# Patient Record
Sex: Male | Born: 1980 | Race: Black or African American | Hispanic: No | Marital: Married | Smoking: Never smoker
Health system: Southern US, Community
[De-identification: ages and names within clinical notes are randomized; demographics above are authoritative.]

## PROBLEM LIST (undated history)

## (undated) DIAGNOSIS — Z9989 Dependence on other enabling machines and devices: Secondary | ICD-10-CM

## (undated) DIAGNOSIS — G4733 Obstructive sleep apnea (adult) (pediatric): Secondary | ICD-10-CM

## (undated) DIAGNOSIS — L409 Psoriasis, unspecified: Secondary | ICD-10-CM

## (undated) HISTORY — PX: NO PAST SURGERIES: SHX2092

## (undated) HISTORY — DX: Obstructive sleep apnea (adult) (pediatric): G47.33

## (undated) HISTORY — DX: Dependence on other enabling machines and devices: Z99.89

## (undated) HISTORY — DX: Psoriasis, unspecified: L40.9

---

## 2015-12-22 DIAGNOSIS — G4733 Obstructive sleep apnea (adult) (pediatric): Secondary | ICD-10-CM | POA: Insufficient documentation

## 2015-12-23 DIAGNOSIS — J309 Allergic rhinitis, unspecified: Secondary | ICD-10-CM | POA: Insufficient documentation

## 2019-05-09 ENCOUNTER — Other Ambulatory Visit: Payer: Self-pay

## 2019-05-09 ENCOUNTER — Ambulatory Visit
Admission: EM | Admit: 2019-05-09 | Discharge: 2019-05-09 | Disposition: A | Discharge status: Home / Self Care | Payer: BC Managed Care – PPO | Attending: Family Medicine | Admitting: Family Medicine

## 2019-05-09 ENCOUNTER — Ambulatory Visit (INDEPENDENT_AMBULATORY_CARE_PROVIDER_SITE_OTHER): Payer: BC Managed Care – PPO

## 2019-05-09 DIAGNOSIS — Y9302 Activity, running: Secondary | ICD-10-CM | POA: Diagnosis not present

## 2019-05-09 DIAGNOSIS — M25572 Pain in left ankle and joints of left foot: Secondary | ICD-10-CM | POA: Diagnosis not present

## 2019-05-09 DIAGNOSIS — S93402A Sprain of unspecified ligament of left ankle, initial encounter: Secondary | ICD-10-CM | POA: Diagnosis not present

## 2019-05-09 MED ORDER — MELOXICAM 15 MG PO TABS: 15.0000 mg | ORAL_TABLET | Freq: Every day | ORAL | 0 refills | Status: DC | PRN

## 2019-05-09 NOTE — ED Triage Notes (Signed)
Patient states that he was running today and heard and felt pop around 12pm in his left ankle. Patient states that pain has been worsening and he is unable to bear weight. Has been applying ice and elevate.

## 2019-05-09 NOTE — ED Provider Notes (Addendum)
[facility]    CSN: [id number] Arrival date & time: 05/09/19  1737      History   Chief Complaint Chief Complaint  Patient presents with  . Ankle Pain    left   HPI  39 year old male presents with the above complaint.  Patient states that he was running today and suffered an inversion injury of his left ankle.  Patient states that he felt a pop.  He reports severe pain, 9/10 in severity.  Difficulty bearing weight.  He has applied ice and elevated the area without resolution.  No relieving factors.  No other associated symptoms.  No other complaints.  Past Surgical History:  Procedure Laterality Date  . NO PAST SURGERIES     Home Medications    Prior to Admission medications   Medication Sig Start Date End Date Taking? Authorizing Provider  Multiple Vitamin (MULTIVITAMIN) tablet Take 1 tablet by mouth daily.   Yes [provider]  meloxicam (MOBIC) 15 MG tablet Take 1 tablet (15 mg total) by mouth daily as needed for pain. 05/09/19   Coral Spikes, DO    Family History Family History  Problem Relation Age of Onset  . Arthritis Mother   . Heart disease Father     Social History Social History   Tobacco Use  . Smoking status: Never Smoker  . Smokeless tobacco: Never Used  Substance Use Topics  . Alcohol use: Yes    Comment: occasionally  . Drug use: Never     Allergies   Patient has no known allergies.   Review of Systems Review of Systems  Constitutional: Negative.   Musculoskeletal:       Left ankle pain.   Physical Exam Triage Vital Signs ED Triage Vitals  Enc Vitals Group     BP 05/09/19 1820 118/85     Pulse Rate 05/09/19 1820 71     Resp 05/09/19 1820 16     Temp 05/09/19 1820 98.9 F (37.2 C)     Temp Source 05/09/19 1820 Oral     SpO2 05/09/19 1820 100 %     Weight 05/09/19 1818 203 lb (92.1 kg)     Height 05/09/19 1818 5\' 9"  (1.753 m)     Head Circumference --      Peak Flow --      Pain Score 05/09/19 1818 9   Pain Loc --      Pain Edu? --      Excl. in [place]? --    Updated Vital Signs BP 118/85 (BP Location: Right Arm)   Pulse 71   Temp 98.9 F (37.2 C) (Oral)   Resp 16   Ht 5\' 9"  (1.753 m)   Wt 92.1 kg   SpO2 100%   BMI 29.98 kg/m   Visual Acuity Right Eye Distance:   Left Eye Distance:   Bilateral Distance:    Right Eye Near:   Left Eye Near:    Bilateral Near:     Physical Exam Vitals and nursing note reviewed.  Constitutional:      General: He is not in acute distress.    Appearance: Normal appearance. He is not ill-appearing.  Eyes:     General:        Right eye: No discharge.        Left eye: No discharge.     Conjunctiva/sclera: Conjunctivae normal.  Cardiovascular:     Rate and Rhythm: Normal rate and regular rhythm.     Heart  sounds: No murmur.  Pulmonary:     Effort: Pulmonary effort is normal.     Breath sounds: Normal breath sounds. No wheezing, rhonchi or rales.  Musculoskeletal:     Comments: Left ankle -no apparent swelling.  Tenderness over the medial malleolus.  No bruising.  Neurological:     Mental Status: He is alert.  Psychiatric:        Mood and Affect: Mood normal.        Behavior: Behavior normal.    UC Treatments / Results  Labs (all labs ordered are listed, but only abnormal results are displayed) Labs Reviewed - No data to display  EKG   Radiology DG Ankle Complete Left  Result Date: 05/09/2019 CLINICAL DATA:  Felt a pop while running, unable to bear weight, worsening pain EXAM: LEFT ANKLE COMPLETE - 3+ VIEW COMPARISON:  None FINDINGS: Osseous mineralization normal. Joint spaces preserved. No acute fracture, dislocation, or bone destruction. IMPRESSION: No acute osseous abnormalities. Electronically Signed   By: Ulyses Southward M.D.   On: 05/09/2019 18:56    Procedures Procedures (including critical care time)  Medications Ordered in UC Medications - No data to display  Initial Impression / Assessment and Plan / UC Course  I have  reviewed the triage vital signs and the nursing notes.  Pertinent labs & imaging results that were available during my care of the patient were reviewed by me and considered in my medical decision making (see chart for details).    39 year old male presents with right ankle pain and swelling after an inversion injury.  X-ray obtained given physical exam and x-ray was independently interpreted by me.  Interpretation: No fracture.  No apparent abnormalities.  Normal x-ray.  Patient appears to have an ankle sprain.  Advise rest, ice, elevation.  Meloxicam as directed.  Supportive care.  Final Clinical Impressions(s) / UC Diagnoses   Final diagnoses:  Sprain of left ankle, unspecified ligament, initial encounter     Discharge Instructions     Rest. Ice. Elevation.  Medication as directed.  Take care  Dr. Adriana Simas    ED Prescriptions    Medication Sig Dispense Auth. Provider   meloxicam (MOBIC) 15 MG tablet Take 1 tablet (15 mg total) by mouth daily as needed for pain. 30 tablet Tommie Sams, DO     PDMP not reviewed this encounter.     Tommie Sams, [place] 05/09/19 1919

## 2019-05-09 NOTE — Discharge Instructions (Signed)
Rest. Ice. Elevation.  Medication as directed.  Take care  Dr. Adriana Simas

## 2019-10-15 DIAGNOSIS — J069 Acute upper respiratory infection, unspecified: Secondary | ICD-10-CM | POA: Insufficient documentation

## 2019-11-19 ENCOUNTER — Ambulatory Visit (INDEPENDENT_AMBULATORY_CARE_PROVIDER_SITE_OTHER): Payer: BC Managed Care – PPO | Admitting: Internal Medicine

## 2019-11-19 VITALS — BP 115/77 | HR 82 | Temp 97.9°F | Ht 67.0 in | Wt 201.0 lb

## 2019-11-19 DIAGNOSIS — Z7189 Other specified counseling: Secondary | ICD-10-CM | POA: Diagnosis not present

## 2019-11-19 DIAGNOSIS — Z9989 Dependence on other enabling machines and devices: Secondary | ICD-10-CM

## 2019-11-19 DIAGNOSIS — G4733 Obstructive sleep apnea (adult) (pediatric): Secondary | ICD-10-CM

## 2019-11-19 NOTE — Progress Notes (Signed)
Sleep Medicine   Office Visit  Patient Name: Cory Ferguson DOB: 04-16-1980 MRN [id number]    Chief Complaint: OSA, new consult  Brief History:  Cecilio presents with a 5 year history of OSA and has been wearing CPAP since diagnosis. Since his diagnosis he has been losing weight in order to reverse his sleep apnea. His current machine is now part of the current recall. He is not comfortable wearing the CPAP a part of the recall. He would also like to discuss other options to treat his OSA instead of CPAP if he still has it. Usually falls asleep around 12 am and wakes around 6-6:30 am, sleeps all through the night. When he has not been wearing his CPAP he does notice he wakes up feeling groggy as well as headaches. The Epworth Sleepiness Score is 7 out of 24 .    ROS  General: (-) fever, (-) chills, (-) night sweat Nose and Sinuses: (-) nasal stuffiness or itchiness, (-) postnasal drip, (-) nosebleeds, (-) sinus trouble. Mouth and Throat: (-) sore throat, (-) hoarseness. Neck: (-) swollen glands, (-) enlarged thyroid, (-) neck pain. Respiratory: - cough, - shortness of breath, - wheezing. Neurologic: - numbness, - tingling. Psychiatric: - anxiety, - depression Sleep behavior: -sleep paralysis -hypnogogic hallucinations -dream enactment -vivid dreams -cataplexy -night terrors -sleep walking   Current Medication: Outpatient Encounter Medications as of 11/19/2019  Medication Sig  . Ciclopirox 1 % shampoo Apply topically.  . meloxicam (MOBIC) 15 MG tablet Take 1 tablet (15 mg total) by mouth daily as needed for pain.  . Multiple Vitamin (MULTIVITAMIN) tablet Take 1 tablet by mouth daily.  . Multiple Vitamins-Minerals (ONE DAILY CALCIUM/IRON) TABS Take by mouth.   No facility-administered encounter medications on file as of 11/19/2019.    Surgical History: Past Surgical History:  Procedure Laterality Date  . NO PAST SURGERIES      Medical History: Past Medical History:  Diagnosis  Date  . OSA on CPAP   . Psoriasis     Family History: Non contributory to the present illness  Social History: Social History   Socioeconomic History  . Marital status: Married    Spouse name: Not on file  . Number of children: Not on file  . Years of education: Not on file  . Highest education level: Not on file  Occupational History  . Not on file  Tobacco Use  . Smoking status: Never Smoker  . Smokeless tobacco: Never Used  Vaping Use  . Vaping Use: Never used  Substance and Sexual Activity  . Alcohol use: Yes    Comment: occasionally  . Drug use: Never  . Sexual activity: Not on file  Other Topics Concern  . Not on file  Social History Narrative  . Not on file   Social Determinants of Health   Financial Resource Strain:   . Difficulty of Paying Living Expenses: Not on file  Food Insecurity:   . Worried About Programme researcher, broadcasting/film/video in the Last Year: Not on file  . Ran Out of Food in the Last Year: Not on file  Transportation Needs:   . Lack of Transportation (Medical): Not on file  . Lack of Transportation (Non-Medical): Not on file  Physical Activity:   . Days of Exercise per Week: Not on file  . Minutes of Exercise per Session: Not on file  Stress:   . Feeling of Stress : Not on file  Social Connections:   . Frequency of Communication with Friends  and Family: Not on file  . Frequency of Social Gatherings with Friends and Family: Not on file  . Attends Religious Services: Not on file  . Active Member of Clubs or Organizations: Not on file  . Attends Banker Meetings: Not on file  . Marital Status: Not on file  Intimate Partner Violence:   . Fear of Current or Ex-Partner: Not on file  . Emotionally Abused: Not on file  . Physically Abused: Not on file  . Sexually Abused: Not on file    Vital Signs: Blood pressure 115/77, pulse 82, temperature 97.9 F (36.6 C), height 5\' 7"  (1.702 m), weight 201 lb (91.2 kg), SpO2 99  %.  Examination: General Appearance: The patient is well-developed, well-nourished, and in no distress. Neck Circumference: 38 Skin: Gross inspection of skin unremarkable. Head: normocephalic, no gross deformities. Eyes: no gross deformities noted. ENT: ears appear grossly normal Neurologic: Alert and oriented. No involuntary movements.    EPWORTH SLEEPINESS SCALE:  Scale:  (0)= no chance of dozing; (1)= slight chance of dozing; (2)= moderate chance of dozing; (3)= high chance of dozing  Chance  Situtation    Sitting and reading: 2    Watching TV: 1    Sitting Inactive in public: 0    As a passenger in car: 3      Lying down to rest: 1    Sitting and talking: 0    Sitting quielty after lunch: 0    In a car, stopped in traffic: 0   TOTAL SCORE:   7 out of 24    SLEEP STUDIES:  1. Not available   LABS: No results found for this or any previous visit (from the past 2160 hour(s)).  Radiology: DG Ankle Complete Left  Result Date: 05/09/2019 CLINICAL DATA:  Felt a pop while running, unable to bear weight, worsening pain EXAM: LEFT ANKLE COMPLETE - 3+ VIEW COMPARISON:  None FINDINGS: Osseous mineralization normal. Joint spaces preserved. No acute fracture, dislocation, or bone destruction. IMPRESSION: No acute osseous abnormalities. Electronically Signed   By: 07/09/2019 M.D.   On: 05/09/2019 18:56    Assessment and Plan: Patient Active Problem List   Diagnosis Date Noted  . OSA (obstructive sleep apnea) 12/22/2015     PLAN OSA:   Patient evaluation suggests high risk of sleep disordered breathing due to snoring and apnea during the night. Patient has comorbid cardiovascular risk factors including: none which could be exacerbated by pathologic sleep-disordered breathing.    1. OSA-Continue with wearing his current CPAP even though it is currently under recall. Will redo PSG to assess for current OSA diagnosis, he would also like to look into getting a new  machine as well as mouth guard. 2. Morbid obesity diet and exercise counseling. Weight loss will certainly help with overall status  General Counseling: I have discussed the findings of the evaluation and examination with Ej.  I have also discussed any further diagnostic evaluation thatmay be needed or ordered today. Sylvester verbalizes understanding of the findings of todays visit. We also reviewed his medications today and discussed drug interactions and side effects including but not limited excessive drowsiness and altered mental states. We also discussed that there is always a risk not just to him but also people around him. he has been encouraged to call the office with any questions or concerns that should arise related to todays visit.     This patient was seen by 12/24/2015 AGNP-C in Collaboration with  Dr. Freda Munro as a part of collaborative care agreement.   I have personally obtained a history, evaluated the patient, evaluated pertinent data, formulated the assessment and plan and placed orders.   Valentino Hue Sol Blazing, PhD, FAASM  Diplomate, American Board of Sleep Medicine    Yevonne Pax, MD [facility] Diplomate ABMS Pulmonary and Critical Care Medicine Sleep medicine

## 2019-11-19 NOTE — Patient Instructions (Signed)

## 2020-01-07 ENCOUNTER — Ambulatory Visit: Payer: BC Managed Care – PPO

## 2020-01-07 NOTE — Progress Notes (Unsigned)
Sleep Medicine   Office Visit  Patient Name: Cory Ferguson DOB: 1980-06-05 MRN [id number]    Chief Complaint: study results  Brief History:  Cory Ferguson is seen for initial consultation to discuss the results of his recent sleep study which did not demonstrate sleep apnea except during supine, REM sleep. The presents with a *** history of ***. This is *** in the *** position.   Sleep quality is ***. This is noted *** nights. The patient's bed partner reports  *** at night. The patient relates the following symptoms: *** are also present. The patient goes to sleep at *** and wakes up at ***.  he reports that his sleep quality is ***.  Sleep quality is *** when outside home environment.  Patient has noted *** of his legs at night.  The patient  relates *** behavior during the night.  The patient *** a history of psychiatric problems. The Epworth Sleepiness Score is *** out of 24 .  The patient relates  Cardiovascular risk factors include: *** The patient reports ***    ROS  General: (-) fever, (-) chills, (-) night sweat Nose and Sinuses: (-) nasal stuffiness or itchiness, (-) postnasal drip, (-) nosebleeds, (-) sinus trouble. Mouth and Throat: (-) sore throat, (-) hoarseness. Neck: (-) swollen glands, (-) enlarged thyroid, (-) neck pain. Respiratory: *** cough, *** shortness of breath, *** wheezing. Neurologic: *** numbness, *** tingling. Psychiatric: *** anxiety, *** depression Sleep behavior: ***sleep paralysis ***hypnogogic hallucinations ***dream enactment      ***vivid dreams ***cataplexy ***night terrors ***sleep walking   Current Medication: Outpatient Encounter Medications as of 01/07/2020  Medication Sig  . Ciclopirox 1 % shampoo Apply topically.  . meloxicam (MOBIC) 15 MG tablet Take 1 tablet (15 mg total) by mouth daily as needed for pain.  . Multiple Vitamin (MULTIVITAMIN) tablet Take 1 tablet by mouth daily.  . Multiple Vitamins-Minerals (ONE DAILY CALCIUM/IRON) TABS Take by  mouth.   No facility-administered encounter medications on file as of 01/07/2020.    Surgical History: Past Surgical History:  Procedure Laterality Date  . NO PAST SURGERIES      Medical History: Past Medical History:  Diagnosis Date  . OSA on CPAP   . Psoriasis     Family History: Non contributory to the present illness  Social History: Social History   Socioeconomic History  . Marital status: Married    Spouse name: Not on file  . Number of children: Not on file  . Years of education: Not on file  . Highest education level: Not on file  Occupational History  . Not on file  Tobacco Use  . Smoking status: Never Smoker  . Smokeless tobacco: Never Used  Vaping Use  . Vaping Use: Never used  Substance and Sexual Activity  . Alcohol use: Yes    Comment: occasionally  . Drug use: Never  . Sexual activity: Not on file  Other Topics Concern  . Not on file  Social History Narrative  . Not on file   Social Determinants of Health   Financial Resource Strain:   . Difficulty of Paying Living Expenses: Not on file  Food Insecurity:   . Worried About Programme researcher, broadcasting/film/video in the Last Year: Not on file  . Ran Out of Food in the Last Year: Not on file  Transportation Needs:   . Lack of Transportation (Medical): Not on file  . Lack of Transportation (Non-Medical): Not on file  Physical Activity:   . Days of  Exercise per Week: Not on file  . Minutes of Exercise per Session: Not on file  Stress:   . Feeling of Stress : Not on file  Social Connections:   . Frequency of Communication with Friends and Family: Not on file  . Frequency of Social Gatherings with Friends and Family: Not on file  . Attends Religious Services: Not on file  . Active Member of Clubs or Organizations: Not on file  . Attends Banker Meetings: Not on file  . Marital Status: Not on file  Intimate Partner Violence:   . Fear of Current or Ex-Partner: Not on file  . Emotionally Abused: Not  on file  . Physically Abused: Not on file  . Sexually Abused: Not on file    Vital Signs: There were no vitals taken for this visit.  Examination: General Appearance: The patient is well-developed, well-nourished, and in no distress. Neck Circumference: *** Skin: Gross inspection of skin unremarkable. Head: normocephalic, no gross deformities. Eyes: no gross deformities noted. ENT: ears appear grossly normal Neurologic: Alert and oriented. No involuntary movements.    EPWORTH SLEEPINESS SCALE:  Scale:  (0)= no chance of dozing; (1)= slight chance of dozing; (2)= moderate chance of dozing; (3)= high chance of dozing  Chance  Situtation    Sitting and reading: ***    Watching TV: ***    Sitting Inactive in public: ***    As a passenger in car: ***      Lying down to rest: ***    Sitting and talking: ***    Sitting quielty after lunch: ***    In a car, stopped in traffic: ***   TOTAL SCORE:   *** out of 24    SLEEP STUDIES:  1. PSG 12/28/19 apnea only supine   LABS: No results found for this or any previous visit (from the past 2160 hour(s)).  Radiology: DG Ankle Complete Left  Result Date: 05/09/2019 CLINICAL DATA:  Felt a pop while running, unable to bear weight, worsening pain EXAM: LEFT ANKLE COMPLETE - 3+ VIEW COMPARISON:  None FINDINGS: Osseous mineralization normal. Joint spaces preserved. No acute fracture, dislocation, or bone destruction. IMPRESSION: No acute osseous abnormalities. Electronically Signed   By: Ulyses Southward M.D.   On: 05/09/2019 18:56    No results found.  No results found.    Assessment and Plan: Patient Active Problem List   Diagnosis Date Noted  . OSA (obstructive sleep apnea) 12/22/2015     The study results and recommendations were discussed with the patient    1. ***  General Counseling: I have discussed the findings of the evaluation and examination with Cory Ferguson.  I have also discussed any further diagnostic  evaluation thatmay be needed or ordered today. Cory Ferguson verbalizes understanding of the findings of todays visit. We also reviewed his medications today and discussed drug interactions and side effects including but not limited excessive drowsiness and altered mental states. We also discussed that there is always a risk not just to him but also people around him. he has been encouraged to call the office with any questions or concerns that should arise related to todays visit.  No orders of the defined types were placed in this encounter.       I have personally obtained a history, evaluated the patient, evaluated pertinent data, formulated the assessment and plan and placed orders.   Valentino Hue Sol Blazing, PhD, FAASM  Diplomate, American Board of Sleep Medicine    Saadat A  Humphrey Rolls, MD [facility] Diplomate ABMS Pulmonary and Critical Care Medicine Sleep medicine

## 2020-01-28 ENCOUNTER — Ambulatory Visit: Payer: BC Managed Care – PPO | Admitting: Internal Medicine

## 2020-02-03 NOTE — Progress Notes (Signed)
Patient was no-show for appointment.  The office staff will contact the patient for rescheduling follow-up. 

## 2020-02-04 ENCOUNTER — Ambulatory Visit (INDEPENDENT_AMBULATORY_CARE_PROVIDER_SITE_OTHER): Payer: BC Managed Care – PPO | Admitting: Internal Medicine

## 2020-02-04 VITALS — BP 148/75 | HR 67 | Temp 97.7°F | Resp 14 | Ht 69.0 in | Wt 206.0 lb

## 2020-02-04 DIAGNOSIS — G4733 Obstructive sleep apnea (adult) (pediatric): Secondary | ICD-10-CM

## 2020-02-04 NOTE — Progress Notes (Signed)
Sleep Medicine   Office Visit  Patient Name: Cory Ferguson DOB: Mar 30, 1980 MRN [id number]    Chief Complaint: study results   HISTORY OF PRESENT ILLNESS: Cory Ferguson is seen today for follow up to discuss the results of his recent repeat sleep study. The finding did not demonstrate sleep apnea. The Epworth Sleepiness Score is 9.  He reports sleeping well. He has some shoulder irritation which may prevent him from sleeping on one side. He is being seeing Dr. Edrick Kins. His CPAP machine is on the recall.  ROS  General: (-) fever, (-) chills, (-) night sweat Nose and Sinuses: (-) nasal stuffiness or itchiness, (-) postnasal drip, (-) nosebleeds, (-) sinus trouble. Mouth and Throat: (-) sore throat, (-) hoarseness. Neck: (-) swollen glands, (-) enlarged thyroid, (-) neck pain. Respiratory: - cough, - shortness of breath, - wheezing. Neurologic: - numbness, - tingling. Psychiatric: - anxiety, - depression  Current Medication: Outpatient Encounter Medications as of 02/04/2020  Medication Sig  . Ciclopirox 1 % shampoo Apply topically.  . meloxicam (MOBIC) 15 MG tablet Take 1 tablet (15 mg total) by mouth daily as needed for pain.  . Multiple Vitamin (MULTIVITAMIN) tablet Take 1 tablet by mouth daily.  . Multiple Vitamins-Minerals (ONE DAILY CALCIUM/IRON) TABS Take by mouth.   No facility-administered encounter medications on file as of 02/04/2020.    Surgical History: Past Surgical History:  Procedure Laterality Date  . NO PAST SURGERIES      Medical History: Past Medical History:  Diagnosis Date  . OSA on CPAP   . Psoriasis     Family History: Non contributory to the present illness  Social History: Social History   Socioeconomic History  . Marital status: Married    Spouse name: Not on file  . Number of children: Not on file  . Years of education: Not on file  . Highest education level: Not on file  Occupational History  . Not on file  Tobacco Use  . Smoking status:  Never Smoker  . Smokeless tobacco: Never Used  Vaping Use  . Vaping Use: Never used  Substance and Sexual Activity  . Alcohol use: Yes    Comment: occasionally  . Drug use: Never  . Sexual activity: Not on file  Other Topics Concern  . Not on file  Social History Narrative  . Not on file   Social Determinants of Health   Financial Resource Strain:   . Difficulty of Paying Living Expenses: Not on file  Food Insecurity:   . Worried About Programme researcher, broadcasting/film/video in the Last Year: Not on file  . Ran Out of Food in the Last Year: Not on file  Transportation Needs:   . Lack of Transportation (Medical): Not on file  . Lack of Transportation (Non-Medical): Not on file  Physical Activity:   . Days of Exercise per Week: Not on file  . Minutes of Exercise per Session: Not on file  Stress:   . Feeling of Stress : Not on file  Social Connections:   . Frequency of Communication with Friends and Family: Not on file  . Frequency of Social Gatherings with Friends and Family: Not on file  . Attends Religious Services: Not on file  . Active Member of Clubs or Organizations: Not on file  . Attends Banker Meetings: Not on file  . Marital Status: Not on file  Intimate Partner Violence:   . Fear of Current or Ex-Partner: Not on file  . Emotionally Abused: Not  on file  . Physically Abused: Not on file  . Sexually Abused: Not on file    Vital Signs: Blood pressure (!) 148/75, pulse 67, temperature 97.7 F (36.5 C), temperature source Temporal, resp. rate 14, height 5\' 9"  (1.753 m), weight 206 lb (93.4 kg), SpO2 99 %.  Examination: General Appearance: The patient is well-developed, well-nourished, and in no distress. Neck Circumference: 39 Skin: Gross inspection of skin unremarkable. Head: normocephalic, no gross deformities. Eyes: no gross deformities noted. ENT: ears appear grossly normal Neurologic: Alert and oriented. No involuntary movements.    EPWORTH SLEEPINESS  SCALE:  Scale:  (0)= no chance of dozing; (1)= slight chance of dozing; (2)= moderate chance of dozing; (3)= high chance of dozing  Chance  Situtation    Sitting and reading: 2    Watching TV: 1    Sitting Inactive in public: 0    As a passenger in car: 3      Lying down to rest: 2    Sitting and talking: 0    Sitting quielty after lunch: 1    In a car, stopped in traffic: 0   TOTAL SCORE:   9 out of 24    SLEEP STUDIES:  1. PSG 10.22.21 -  AHI  2.3,  low SpO2  92%   LABS: No results found for this or any previous visit (from the past 2160 hour(s)).  Radiology: DG Ankle Complete Left  Result Date: 05/09/2019 CLINICAL DATA:  Felt a pop while running, unable to bear weight, worsening pain EXAM: LEFT ANKLE COMPLETE - 3+ VIEW COMPARISON:  None FINDINGS: Osseous mineralization normal. Joint spaces preserved. No acute fracture, dislocation, or bone destruction. IMPRESSION: No acute osseous abnormalities. Electronically Signed   By: 07/09/2019 M.D.   On: 05/09/2019 18:56    No results found.  No results found.    Assessment and Plan: Patient Active Problem List   Diagnosis Date Noted  . Upper respiratory tract infection 10/15/2019  . Allergic rhinitis 12/23/2015  . OSA (obstructive sleep apnea) 12/22/2015     PLAN OSA:   The study results were discussed with the patient. He no longer has significant sleep apnea. He never tolerated CPAP and at this point no longer requires CPAP. He was advised to remain vigilant for any returning symptoms of sleep apnea. He should avoid sleeping on his back . He is interested in treating his snoring and will follow up with Dr. 12/24/2015 regarding the oral appliance. This may be of significant benefit if his weight varies. He is treating his nasal congestion.   1. OSA- patient no longer has significant sleep apnea. Will follow up with Dr. Edrick Kins.  General Counseling: I have discussed the findings of the evaluation and examination  with [facility].  I have also discussed any further diagnostic evaluation thatmay be needed or ordered today. Marven verbalizes understanding of the findings of todays visit. We also reviewed his medications today and discussed drug interactions and side effects including but not limited excessive drowsiness and altered mental states. We also discussed that there is always a risk not just to him but also people around him. he has been encouraged to call the office with any questions or concerns that should arise related to todays visit.  No orders of the defined types were placed in this encounter.       I have personally obtained a history, evaluated the patient, evaluated pertinent data, formulated the assessment and plan and placed orders.   Edrick Kins  Bonne Dolores, PhD, FAASM  Diplomate, American Board of Sleep Medicine    Yevonne Pax, MD [facility] Diplomate ABMS Pulmonary and Critical Care Medicine Sleep medicine

## 2020-02-08 ENCOUNTER — Encounter: Payer: Self-pay | Admitting: Internal Medicine

## 2020-03-17 ENCOUNTER — Other Ambulatory Visit: Payer: Self-pay

## 2020-03-17 DIAGNOSIS — Z20822 Contact with and (suspected) exposure to covid-19: Secondary | ICD-10-CM

## 2020-03-18 LAB — NOVEL CORONAVIRUS, NAA: SARS-CoV-2, NAA: NOT DETECTED

## 2020-03-18 LAB — SARS-COV-2, NAA 2 DAY TAT

## 2020-03-18 LAB — SPECIMEN STATUS REPORT

## 2021-06-01 IMAGING — CR DG ANKLE COMPLETE 3+V*L*
4 series · 4 of 4 positions shown · non-contrast
Comparison: None

CLINICAL DATA: Felt a pop while running, unable to bear weight,
worsening pain

EXAM:
LEFT ANKLE COMPLETE - 3+ VIEW

[ankle ap]
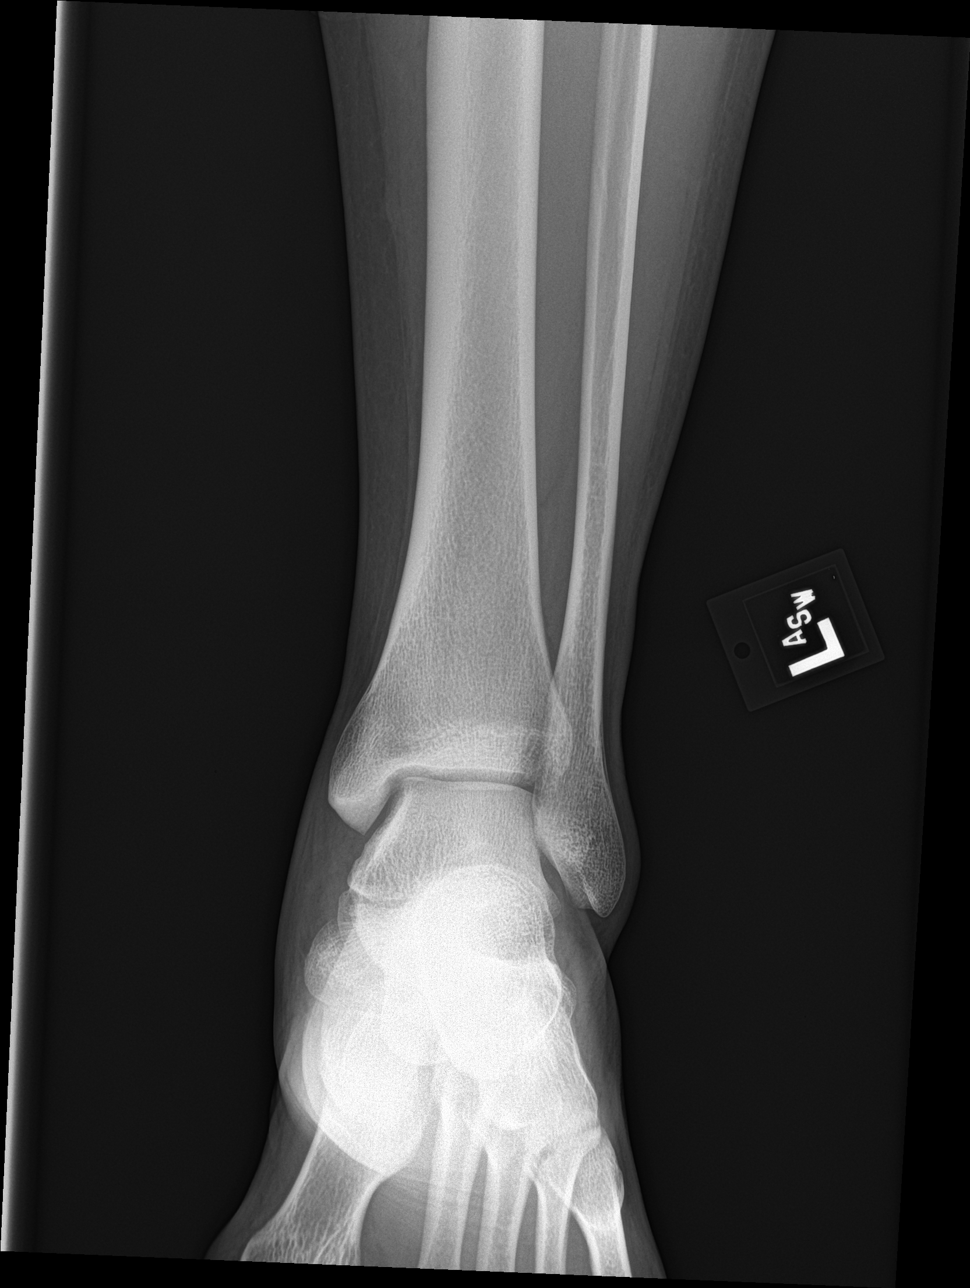

[ankle obl (1 of 2)]
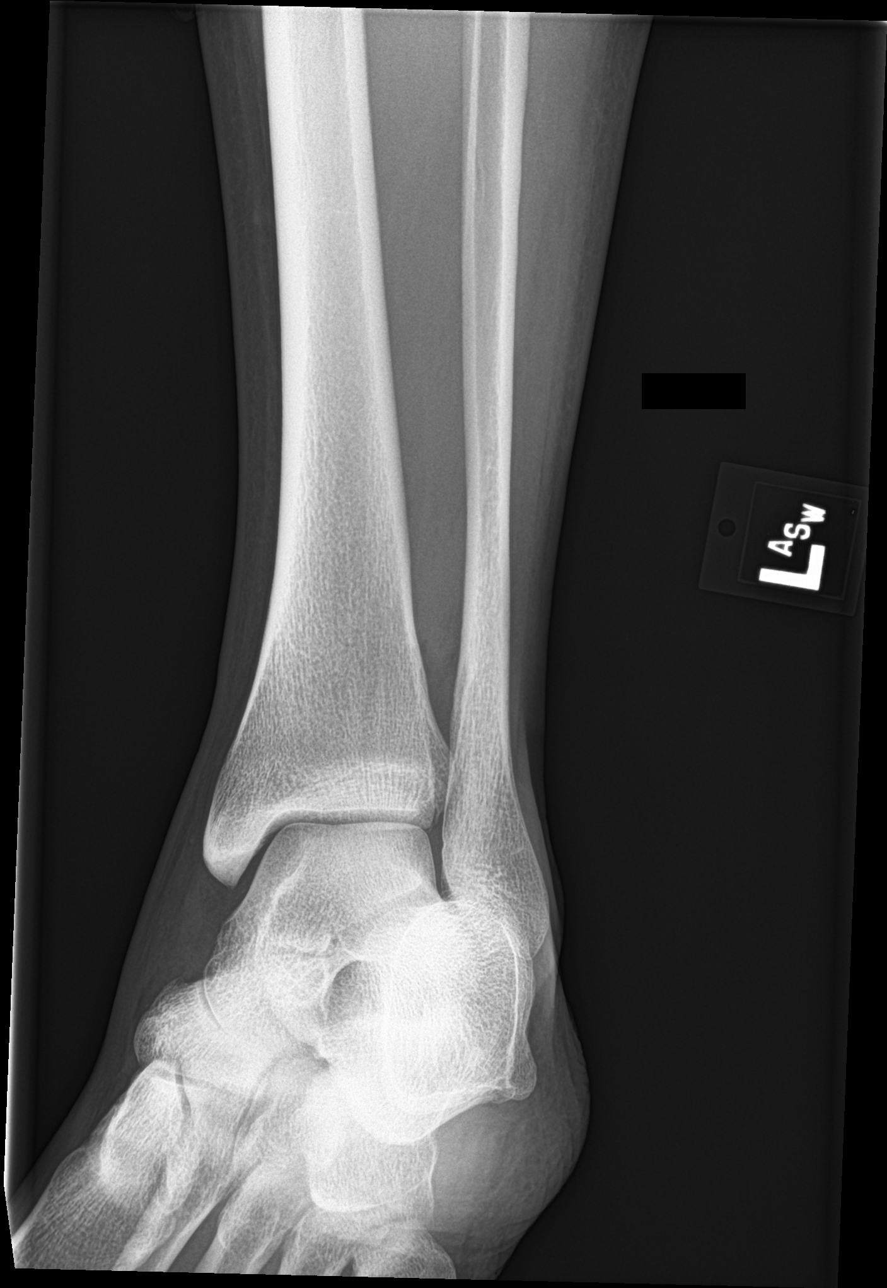

[ankle lat]
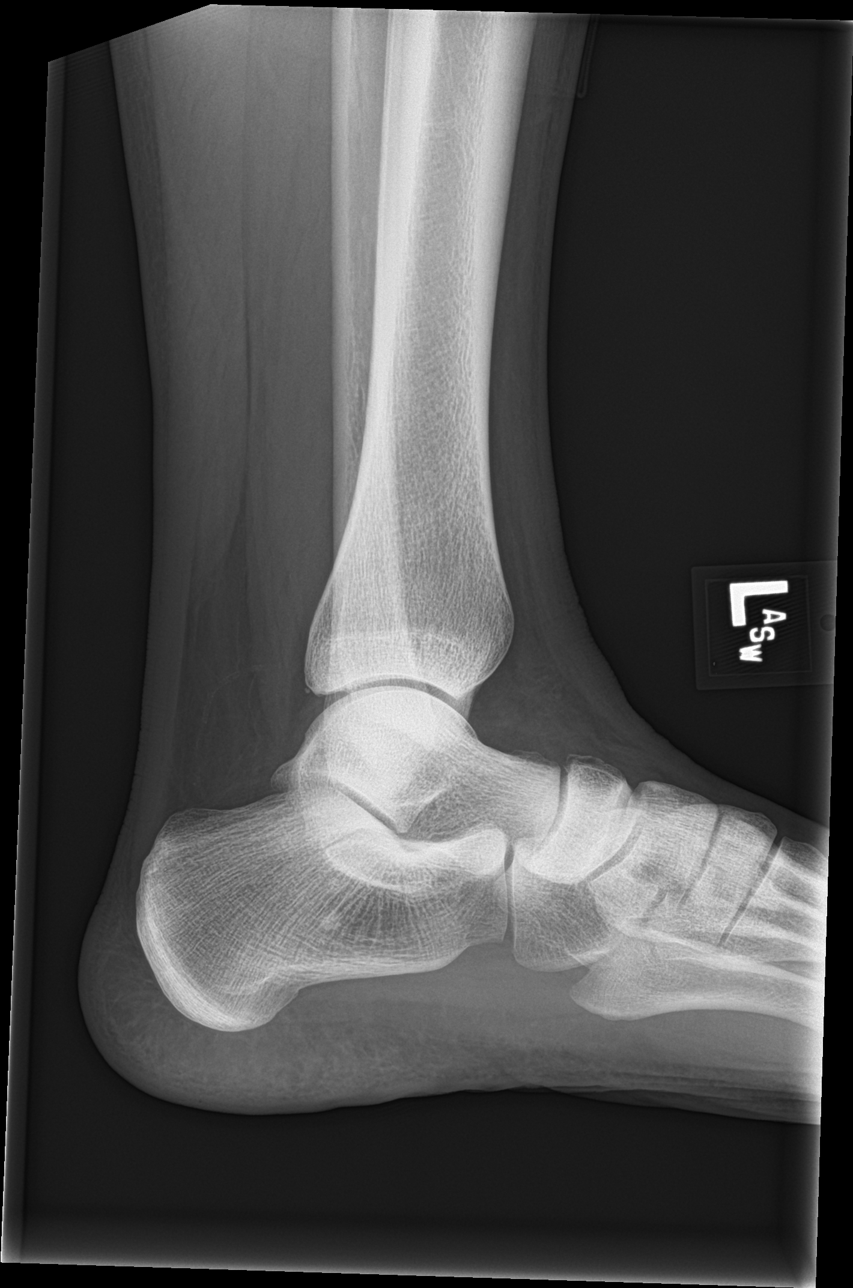

[ankle obl (2 of 2)]
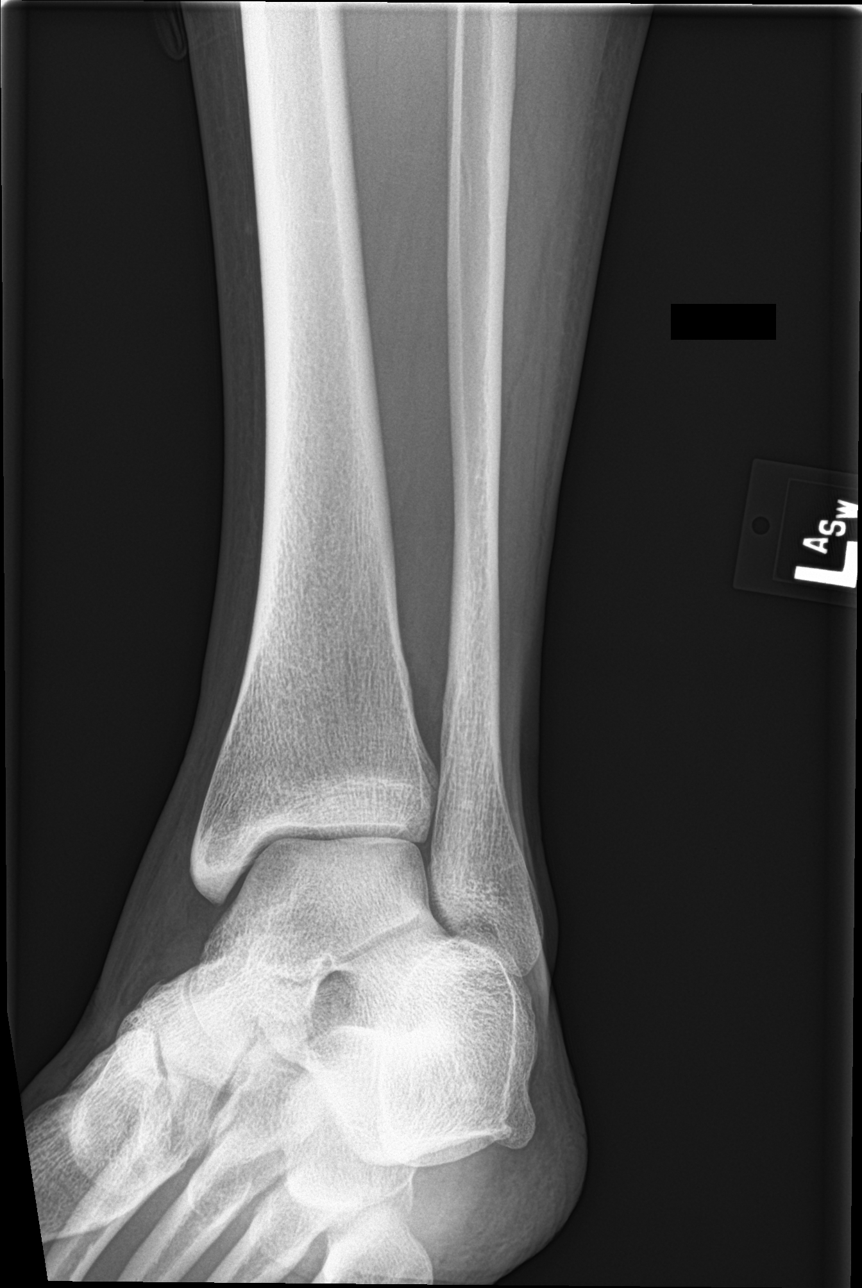

[4 of 4 positions shown; findings below may reference images not displayed]

FINDINGS: Osseous mineralization normal.

Joint spaces preserved.

No acute fracture, dislocation, or bone destruction.
IMPRESSION: No acute osseous abnormalities.

## 2023-08-28 ENCOUNTER — Encounter: Payer: Self-pay | Admitting: Emergency Medicine

## 2023-08-28 ENCOUNTER — Ambulatory Visit
Admission: EM | Admit: 2023-08-28 | Discharge: 2023-08-28 | Disposition: A | Discharge status: Home / Self Care | Attending: Emergency Medicine | Admitting: Emergency Medicine

## 2023-08-28 DIAGNOSIS — B9789 Other viral agents as the cause of diseases classified elsewhere: Secondary | ICD-10-CM

## 2023-08-28 DIAGNOSIS — J329 Chronic sinusitis, unspecified: Secondary | ICD-10-CM

## 2023-08-28 MED ORDER — IBUPROFEN 600 MG PO TABS: 600.0000 mg | ORAL_TABLET | Freq: Three times a day (TID) | ORAL | 0 refills | Status: AC | PRN

## 2023-08-28 MED ORDER — FLUTICASONE PROPIONATE 50 MCG/ACT NA SUSP: 2.0000 | Freq: Every day | NASAL | 0 refills | Status: AC

## 2023-08-28 MED ORDER — PROMETHAZINE-DM 6.25-15 MG/5ML PO SYRP: 5.0000 mL | ORAL_SOLUTION | Freq: Four times a day (QID) | ORAL | 0 refills | Status: AC | PRN

## 2023-08-28 NOTE — ED Provider Notes (Signed)
 HPI  SUBJECTIVE:  Cory Ferguson is a 43 y.o. male who presents with 4 days of nasal congestion, yellow rhinorrhea, sinus pain and pressure, cough productive of yellowish sputum.  He is able to sleep throughout the night without waking up coughing.  No facial swelling, upper dental pain, fevers, wheezing, chest pain, shortness of breath, dyspnea on exertion, double sickening.  His son was diagnosed with bronchitis earlier this week.  No antibiotics in the past 3 months.  He took children's ibuprofen within the past 6 hours without improvement in his symptoms.  He has also tried popsicles and drink honey with improvement.  Symptoms are worse with lying down.  He has no past medical history.  PCP: [facility]    Past Medical History:  Diagnosis Date   OSA on CPAP    Psoriasis     Past Surgical History:  Procedure Laterality Date   NO PAST SURGERIES      Family History  Problem Relation Age of Onset   Arthritis Mother    Heart disease Father     Social History   Tobacco Use   Smoking status: Never   Smokeless tobacco: Never  Vaping Use   Vaping status: Never Used  Substance Use Topics   Alcohol use: Yes    Comment: occasionally   Drug use: Never    No current facility-administered medications for this encounter.  Current Outpatient Medications:    fluticasone (FLONASE) 50 MCG/ACT nasal spray, Place 2 sprays into both nostrils daily., Disp: 16 g, Rfl: 0   ibuprofen (ADVIL) 600 MG tablet, Take 1 tablet (600 mg total) by mouth every 8 (eight) hours as needed., Disp: 30 tablet, Rfl: 0   promethazine-dextromethorphan (PROMETHAZINE-DM) 6.25-15 MG/5ML syrup, Take 5 mLs by mouth 4 (four) times daily as needed for cough., Disp: 118 mL, Rfl: 0   Ciclopirox 1 % shampoo, Apply topically., Disp: , Rfl:    Multiple Vitamin (MULTIVITAMIN) tablet, Take 1 tablet by mouth daily., Disp: , Rfl:    Multiple Vitamins-Minerals (ONE DAILY CALCIUM/IRON) TABS, Take by mouth., Disp: , Rfl:    Allergies  Allergen Reactions   Lactose      ROS  As noted in HPI.   Physical Exam  BP 118/85 (BP Location: Left Arm)   Pulse 63   Temp 98.2 F (36.8 C) (Oral)   Resp 15   Ht 5' 9 (1.753 m)   Wt 91.2 kg   SpO2 99%   BMI 29.68 kg/m   Constitutional: Well developed, well nourished, no acute distress Eyes: PERRL, EOMI, conjunctiva normal bilaterally HENT: Normocephalic, atraumatic,mucus membranes moist.  Positive nasal congestion.  Erythematous, swollen turbinates.  Positive maxillary, frontal sinus tenderness.  Positive postnasal drip. Neck: No cervical lymphadenopathy. Respiratory: Clear to auscultation bilaterally, no rales, no wheezing, no rhonchi.  No anterior, lateral chest wall tenderness Cardiovascular: Normal rate and rhythm, no murmurs, no gallops, no rubs GI: nondistended skin: No rash, skin intact Musculoskeletal: no deformities Neurologic: Alert & oriented x 3, CN III-XII grossly intact, no motor deficits, sensation grossly intact Psychiatric: Speech and behavior appropriate   ED Course   Medications - No data to display  No orders of the defined types were placed in this encounter.  No results found for this or any previous visit (from the past 24 hours). No results found.  ED Clinical Impression  1. Viral sinusitis      ED Assessment/Plan     Patient declined COVID testing.  Presentation consistent with acute  pansinusitis.  He does not meet criteria for antibiotics according to the ISDA criteria. discussed this with patient.  Mucinex D, saline nasal irrigation, Flonase, Tylenol/ibuprofen, Promethazine DM.  Return here or follow-up with PCP for double sickening, fevers above 102, facial swelling, upper dental pain, or if not better in 5 days and we can reevaluate him and determine the next best step.  Patient is amenable to this plan.  Discussed , MDM, treatment plan, and plan for follow-up with patient Discussed sn/sx that should prompt  return to the ED. patient agrees with plan.   Meds ordered this encounter  Medications   fluticasone (FLONASE) 50 MCG/ACT nasal spray    Sig: Place 2 sprays into both nostrils daily.    Dispense:  16 g    Refill:  0   ibuprofen (ADVIL) 600 MG tablet    Sig: Take 1 tablet (600 mg total) by mouth every 8 (eight) hours as needed.    Dispense:  30 tablet    Refill:  0   promethazine-dextromethorphan (PROMETHAZINE-DM) 6.25-15 MG/5ML syrup    Sig: Take 5 mLs by mouth 4 (four) times daily as needed for cough.    Dispense:  118 mL    Refill:  0      *This clinic note was created using Scientist, clinical (histocompatibility and immunogenetics). Therefore, there may be occasional mistakes despite careful proofreading. ?    Van Knee, MD 08/28/23 [facility]

## 2023-08-28 NOTE — Discharge Instructions (Signed)
 Start Mucinex-D to keep the mucous thin and to decongest you.  Flonase will help with the postnasal drip.  You may take 600 mg of motrin with 1000 mg of tylenol up to 3-4 times a day as needed for pain. This is an effective combination for pain.  Most sinus infections are viral and do not need antibiotics unless you have a high fever, have had this for 10 days, or you get better and then get sick again. Use a NeilMed sinus rinse with distilled water as often as you want to to reduce nasal congestion. Follow the directions on the box.  For mepazine DM for cough.  Go to www.goodrx.com to look up your medications. This will give you a list of where you can find your prescriptions at the most affordable prices. Or you can ask the pharmacist what the cash price is. This is frequently cheaper than going through insurance.

## 2023-08-28 NOTE — ED Triage Notes (Signed)
 Patient c/o cough and chest congestion for 4 days.  Patient unsure of fevers.  Patient states that his son was diagnosed earlier in the week with Bronchitis.
